# Patient Record
Sex: Male | Born: 1970 | Race: Black or African American | Hispanic: No | Marital: Single | Smoking: Never smoker
Health system: Southern US, Community
[De-identification: ages and names within clinical notes are randomized; demographics above are authoritative.]

## PROBLEM LIST (undated history)

## (undated) DIAGNOSIS — I10 Essential (primary) hypertension: Secondary | ICD-10-CM

## (undated) DIAGNOSIS — M1990 Unspecified osteoarthritis, unspecified site: Secondary | ICD-10-CM

## (undated) HISTORY — PX: ROTATOR CUFF REPAIR: SHX139

## (undated) HISTORY — PX: KNEE SURGERY: SHX244

## (undated) HISTORY — PX: COSMETIC SURGERY: SHX468

---

## 2016-09-17 ENCOUNTER — Encounter (HOSPITAL_COMMUNITY): Payer: Self-pay | Admitting: Physical Medicine and Rehabilitation

## 2016-09-17 ENCOUNTER — Emergency Department (HOSPITAL_COMMUNITY): Payer: Medicare Other

## 2016-09-17 ENCOUNTER — Emergency Department (HOSPITAL_COMMUNITY)
Admission: EM | Admit: 2016-09-17 | Discharge: 2016-09-17 | Disposition: A | Discharge status: Home / Self Care | Payer: Medicare Other | Attending: Emergency Medicine | Admitting: Emergency Medicine

## 2016-09-17 DIAGNOSIS — Y999 Unspecified external cause status: Secondary | ICD-10-CM | POA: Diagnosis not present

## 2016-09-17 DIAGNOSIS — S99912A Unspecified injury of left ankle, initial encounter: Secondary | ICD-10-CM | POA: Diagnosis present

## 2016-09-17 DIAGNOSIS — Y92410 Unspecified street and highway as the place of occurrence of the external cause: Secondary | ICD-10-CM | POA: Insufficient documentation

## 2016-09-17 DIAGNOSIS — Y939 Activity, unspecified: Secondary | ICD-10-CM | POA: Diagnosis not present

## 2016-09-17 DIAGNOSIS — S93402A Sprain of unspecified ligament of left ankle, initial encounter: Secondary | ICD-10-CM | POA: Diagnosis not present

## 2016-09-17 DIAGNOSIS — W010XXA Fall on same level from slipping, tripping and stumbling without subsequent striking against object, initial encounter: Secondary | ICD-10-CM | POA: Diagnosis not present

## 2016-09-17 MED ORDER — IBUPROFEN 400 MG PO TABS
400.0000 mg | ORAL_TABLET | Freq: Once | ORAL | Status: AC
  Administered 2016-09-17: 400 mg via ORAL
  Filled 2016-09-17: qty 1

## 2016-09-17 MED ORDER — HYDROCODONE-ACETAMINOPHEN 5-325 MG PO TABS: ORAL_TABLET | ORAL | 0 refills | Status: DC

## 2016-09-17 NOTE — ED Notes (Signed)
Pt d/c home via w/c.

## 2016-09-17 NOTE — Discharge Instructions (Signed)
Rest, Ice intermittently (in the first 24-48 hours), Gentle compression with an Ace wrap, and elevate (Limb above the level of the heart) °  °Take up to 800mg of ibuprofen (that is usually 4 over the counter pills)  3 times a day for 5 days. Take with food. ° °Please follow with your primary care doctor in the next 2 days for a check-up. They must obtain records for further management.  ° °Do not hesitate to return to the Emergency Department for any new, worsening or concerning symptoms.  ° °

## 2016-09-17 NOTE — ED Provider Notes (Signed)
[facility] Provider Note   CSN: [id number] Arrival date & time: 09/17/16  1412  By signing my name below, I, Freida Busman, attest that this documentation has been prepared under the direction and in the presence of non-physician practitioner, Wynetta Emery, PA-C. Electronically Signed: Freida Busman, Scribe. 09/17/2016. 3:42 PM.  History   Chief Complaint Chief Complaint  Patient presents with  . Ankle Pain    The history is provided by the patient. No language interpreter was used.     HPI Comments:  Johnny Warner is a 45 y.o. male who presents to the Emergency Department complaining of moderate, throbbing, left ankle pain s/p fall today. Pt states he slipped,  fell forward, and his left leg ended up underneath him. His pain is exacerbated when he attempts to bear weight. Pt has no other complaints or symptoms at this time. No alleviating factors noted.    History reviewed. No pertinent past medical history.  There are no active problems to display for this patient.   History reviewed. No pertinent surgical history.     Home Medications    Prior to Admission medications   Not on File    Family History No family history on file.  Social History Social History  Substance Use Topics  . Smoking status: Never Smoker  . Smokeless tobacco: Never Used  . Alcohol use No     Allergies   Review of patient's allergies indicates no known allergies.   Review of Systems Review of Systems  10 systems reviewed and all are negative for acute change except as noted in the HPI.  Physical Exam Updated Vital Signs BP 114/81   Pulse 62   Temp 98.2 F (36.8 C) (Oral)   Resp 18   SpO2 99%   Physical Exam  Constitutional: He is oriented to person, place, and time. He appears well-developed and well-nourished. No distress.  HENT:  Head: Normocephalic and atraumatic.  Eyes: Conjunctivae are normal.  Cardiovascular: Normal rate.   Pulmonary/Chest: Effort  normal.  Abdominal: He exhibits no distension.  Musculoskeletal: Normal range of motion. He exhibits tenderness. He exhibits no edema or deformity.  Left ankle: No deformity, no overlying skin changes, mild swelling and tenderness to palpation along the inferior, lateral malleolus. No bony tenderness palpation, distally neurovascularly intact.   Neurological: He is alert and oriented to person, place, and time. Cranial nerve deficit: .npmdm.  Skin: Skin is warm and dry.  Psychiatric: He has a normal mood and affect.  Nursing note and vitals reviewed.  ED Treatments / Results  DIAGNOSTIC STUDIES:  Oxygen Saturation is 99% on RA, normal by my interpretation.    COORDINATION OF CARE:  3:41 PM Discussed treatment plan with pt at bedside and pt agreed to plan.  Labs (all labs ordered are listed, but only abnormal results are displayed) Labs Reviewed - No data to display  EKG  EKG Interpretation None       Radiology Dg Ankle Complete Left  Result Date: 09/17/2016 CLINICAL DATA:  Left ankle pain after fall today. EXAM: LEFT ANKLE COMPLETE - 3+ VIEW COMPARISON:  None. FINDINGS: There is no evidence of fracture, dislocation, or joint effusion. There is no evidence of arthropathy or other focal bone abnormality. Soft tissues are unremarkable. IMPRESSION: Normal left ankle. Electronically Signed   By: Lupita Raider, M.D.   On: 09/17/2016 15:31    Procedures Procedures (including critical care time)  Medications Ordered in ED Medications - No data to display  Initial Impression / Assessment and Plan / ED Course  I have reviewed the triage vital signs and the nursing notes.  Pertinent labs & imaging results that were available during my care of the patient were reviewed by me and considered in my medical decision making (see chart for details).  Clinical Course    Vitals:   09/17/16 1501  BP: 114/81  Pulse: 62  Resp: 18  Temp: 98.2 F (36.8 C)  TempSrc: Oral  SpO2:  99%    Medications  ibuprofen (ADVIL,MOTRIN) tablet 400 mg (400 mg Oral Given 09/17/16 1555)    Johnny Warner is 45 y.o. male presenting with Ankle pain status post mechanical slip and fall with no other trauma. Neurovascularly intact with mild tenderness to palpation. X-rays negative. Will treat with rest, ice, compression elevation, NSAIDS. Patient given crutches and an orthopedic follow-up.   Evaluation does not show pathology that would require ongoing emergent intervention or inpatient treatment. Pt is hemodynamically stable and mentating appropriately. Discussed findings and plan with patient/guardian, who agrees with care plan. All questions answered. Return precautions discussed and outpatient follow up given.      Final Clinical Impressions(s) / ED Diagnoses   Final diagnoses:  None    New Prescriptions New Prescriptions   No medications on file    I personally performed the services described in this documentation, which was scribed in my presence. The recorded information has been reviewed and is accurate.    Wynetta Emeryicole Zakkary Thibault, PA-C 09/17/16 1730    Lyndal Pulleyaniel Knott, MD 09/18/16 [phone]

## 2016-09-17 NOTE — ED Notes (Signed)
Registration at bedside with pt.  

## 2016-09-17 NOTE — ED Triage Notes (Signed)
Pt reports he fell this morning, states he slipped on broom handle on street. Now c/o L ankle pain.

## 2016-09-17 NOTE — ED Notes (Signed)
Patient transported to X-ray 

## 2018-02-03 ENCOUNTER — Emergency Department (HOSPITAL_COMMUNITY)
Admission: EM | Admit: 2018-02-03 | Discharge: 2018-02-04 | Disposition: A | Discharge status: Home / Self Care | Payer: Medicare Other | Attending: Emergency Medicine | Admitting: Emergency Medicine

## 2018-02-03 ENCOUNTER — Emergency Department (HOSPITAL_COMMUNITY): Payer: Medicare Other

## 2018-02-03 ENCOUNTER — Encounter (HOSPITAL_COMMUNITY): Payer: Self-pay | Admitting: Emergency Medicine

## 2018-02-03 DIAGNOSIS — I10 Essential (primary) hypertension: Secondary | ICD-10-CM | POA: Diagnosis not present

## 2018-02-03 DIAGNOSIS — R0789 Other chest pain: Secondary | ICD-10-CM

## 2018-02-03 DIAGNOSIS — R079 Chest pain, unspecified: Secondary | ICD-10-CM | POA: Diagnosis present

## 2018-02-03 HISTORY — DX: Essential (primary) hypertension: I10

## 2018-02-03 LAB — CBC
HEMATOCRIT: 44.5 % (ref 39.0–52.0)
HEMOGLOBIN: 15 g/dL (ref 13.0–17.0)
MCH: 30.5 pg (ref 26.0–34.0)
MCHC: 33.7 g/dL (ref 30.0–36.0)
MCV: 90.4 fL (ref 78.0–100.0)
Platelets: 216 10*3/uL (ref 150–400)
RBC: 4.92 MIL/uL (ref 4.22–5.81)
RDW: 12.9 % (ref 11.5–15.5)
WBC: 7.6 10*3/uL (ref 4.0–10.5)

## 2018-02-03 LAB — BASIC METABOLIC PANEL
ANION GAP: 10 (ref 5–15)
BUN: 15 mg/dL (ref 6–20)
CO2: 24 mmol/L (ref 22–32)
Calcium: 9.1 mg/dL (ref 8.9–10.3)
Chloride: 105 mmol/L (ref 101–111)
Creatinine, Ser: 1.14 mg/dL (ref 0.61–1.24)
GFR calc Af Amer: >60 mL/min (ref >60)
Glucose, Bld: 101 mg/dL — ABNORMAL HIGH (ref 65–99)
POTASSIUM: 3.8 mmol/L (ref 3.5–5.1)
SODIUM: 139 mmol/L (ref 135–145)

## 2018-02-03 LAB — I-STAT TROPONIN, ED
Troponin i, poc: 0 ng/mL (ref 0.00–0.08)
Troponin i, poc: 0 ng/mL (ref 0.00–0.08)

## 2018-02-03 MED ORDER — IBUPROFEN 800 MG PO TABS: 800.0000 mg | ORAL_TABLET | Freq: Three times a day (TID) | ORAL | 0 refills | Status: DC

## 2018-02-03 MED ORDER — KETOROLAC TROMETHAMINE 60 MG/2ML IM SOLN
30.0000 mg | Freq: Once | INTRAMUSCULAR | Status: AC
  Administered 2018-02-03: 30 mg via INTRAMUSCULAR
  Filled 2018-02-03: qty 2

## 2018-02-03 NOTE — ED Provider Notes (Addendum)
[facility] EMERGENCY DEPARTMENT Provider Note   CSN: [id number] Arrival date & time: 02/03/18  1810     History   Chief Complaint Chief Complaint  Patient presents with  . Chest Pain    HPI Johnny Warner is a 47 y.o. male.  The history is provided by the patient.  Chest Pain   This is a new problem. The current episode started 6 to 12 hours ago. The problem occurs constantly. The problem has not changed since onset.The pain is associated with movement. The pain is present in the lateral region. The pain is moderate. The quality of the pain is described as dull. The pain does not radiate. The symptoms are aggravated by certain positions. Pertinent negatives include no abdominal pain, no back pain, no claudication, no cough, no diaphoresis, no dizziness, no exertional chest pressure, no fever, no headaches, no hemoptysis, no irregular heartbeat, no leg pain, no lower extremity edema, no malaise/fatigue, no nausea, no near-syncope, no numbness, no orthopnea, no palpitations, no PND, no shortness of breath, no sputum production, no syncope, no vomiting and no weakness. He has tried nothing for the symptoms. The treatment provided no relief. Risk factors include male gender.  Pertinent negatives for past medical history include Turner syndrome.  Pertinent negatives for family medical history include: no Marfan's syndrome.  Procedure history is negative for persantine thallium.  No leg swelling or pain, no long car trips or plan trips.  No surgery.  No leg pain or swelling.  No n/v/d.  No exertional symptoms.  Slept on a pull out bed last night and awoke with pain.    Past Medical History:  Diagnosis Date  . Hypertension     There are no active problems to display for this patient.   History reviewed. No pertinent surgical history.     Home Medications    Prior to Admission medications   Medication Sig Start Date End Date Taking? Authorizing Provider    HYDROcodone-acetaminophen (NORCO/VICODIN) 5-325 MG tablet Take 1-2 tablets by mouth every 6 hours as needed for pain and/or cough. 09/17/16   Pisciotta, Mardella Layman    Family History History reviewed. No pertinent family history.  Social History Social History   Tobacco Use  . Smoking status: Never Smoker  . Smokeless tobacco: Never Used  Substance Use Topics  . Alcohol use: No  . Drug use: No     Allergies   Patient has no known allergies.   Review of Systems Review of Systems  Constitutional: Negative for diaphoresis, fever and malaise/fatigue.  Respiratory: Negative for cough, hemoptysis, sputum production, choking, chest tightness and shortness of breath.   Cardiovascular: Positive for chest pain. Negative for palpitations, orthopnea, claudication, leg swelling, syncope, PND and near-syncope.  Gastrointestinal: Negative for abdominal pain, nausea and vomiting.  Musculoskeletal: Negative for arthralgias, back pain, neck pain and neck stiffness.  Neurological: Negative for dizziness, weakness, numbness and headaches.  All other systems reviewed and are negative.    Physical Exam Updated Vital Signs BP (!) 139/101 (BP Location: Right Arm)   Pulse 63   Temp 97.9 F (36.6 C) (Oral)   Resp 18   Ht 5\' 8"  (1.727 m)   Wt 111.1 kg (245 lb)   SpO2 97%   BMI 37.25 kg/m   Physical Exam  Constitutional: He is oriented to person, place, and time. He appears well-developed and well-nourished. No distress.  HENT:  Head: Normocephalic and atraumatic.  Mouth/Throat: No oropharyngeal exudate.  Eyes: Conjunctivae are  normal. Pupils are equal, round, and reactive to light.  Neck: Normal range of motion.  Cardiovascular: Normal rate, regular rhythm, normal heart sounds and intact distal pulses.  Pulmonary/Chest: Effort normal and breath sounds normal. No stridor. No respiratory distress. He has no wheezes. He has no rales. He exhibits tenderness.  Abdominal: Soft. Bowel sounds  are normal. He exhibits no mass. There is no tenderness. There is no rebound and no guarding. No hernia.  Musculoskeletal: Normal range of motion. He exhibits no edema, tenderness or deformity.  Negative Homan's sign  Neurological: He is alert and oriented to person, place, and time. He displays normal reflexes.  Skin: Skin is warm and dry. Capillary refill takes less than 2 seconds.  Psychiatric: He has a normal mood and affect.  Nursing note and vitals reviewed.    ED Treatments / Results  Labs (all labs ordered are listed, but only abnormal results are displayed)  Results for orders placed or performed during the hospital encounter of 02/03/18  Basic metabolic panel  Result Value Ref Range   Sodium 139 135 - 145 mmol/L   Potassium 3.8 3.5 - 5.1 mmol/L   Chloride 105 101 - 111 mmol/L   CO2 24 22 - 32 mmol/L   Glucose, Bld 101 (H) 65 - 99 mg/dL   BUN 15 6 - 20 mg/dL   Creatinine, Ser 1.30 0.61 - 1.24 mg/dL   Calcium 9.1 8.9 - 86.5 mg/dL   GFR calc non Af Amer >60 >60 mL/min   GFR calc Af Amer >60 >60 mL/min   Anion gap 10 5 - 15  CBC  Result Value Ref Range   WBC 7.6 4.0 - 10.5 K/uL   RBC 4.92 4.22 - 5.81 MIL/uL   Hemoglobin 15.0 13.0 - 17.0 g/dL   HCT 78.4 69.6 - 29.5 %   MCV 90.4 78.0 - 100.0 fL   MCH 30.5 26.0 - 34.0 pg   MCHC 33.7 30.0 - 36.0 g/dL   RDW 28.4 13.2 - 44.0 %   Platelets 216 150 - 400 K/uL  I-stat troponin, ED  Result Value Ref Range   Troponin i, poc 0.00 0.00 - 0.08 ng/mL   Comment 3          I-Stat Troponin, ED (not at [facility])  Result Value Ref Range   Troponin i, poc 0.00 0.00 - 0.08 ng/mL   Comment 3           Dg Chest 2 View  Result Date: 02/03/2018 CLINICAL DATA:  Left-sided chest pain for 2 hours. EXAM: CHEST  2 VIEW COMPARISON:  None. FINDINGS: The cardiac silhouette, mediastinal and hilar contours are within normal limits given the low lung volumes. Low lung volumes with streaky basilar atelectasis and mild vascular crowding. No infiltrates  or effusions. No pulmonary edema. IMPRESSION: No acute cardiopulmonary findings. Electronically Signed   By: Rudie Meyer M.D.   On: 02/03/2018 18:51    EKG  EKG Interpretation  Date/Time:  Thursday February 03 2018 18:13:45 EST Ventricular Rate:  59 PR Interval:  164 QRS Duration: 90 QT Interval:  418 QTC Calculation: 413 R Axis:   39 Text Interpretation:  Sinus bradycardia Confirmed by Montarius Kitagawa ([id number]) on 02/03/2018 11:06:53 PM       Radiology Dg Chest 2 View  Result Date: 02/03/2018 CLINICAL DATA:  Left-sided chest pain for 2 hours. EXAM: CHEST  2 VIEW COMPARISON:  None. FINDINGS: The cardiac silhouette, mediastinal and hilar contours are within normal limits given the  low lung volumes. Low lung volumes with streaky basilar atelectasis and mild vascular crowding. No infiltrates or effusions. No pulmonary edema. IMPRESSION: No acute cardiopulmonary findings. Electronically Signed   By: Rudie MeyerP.  Gallerani M.D.   On: 02/03/2018 18:51    Procedures Procedures (including critical care time)  Medications Ordered in ED Medications  ketorolac (TORADOL) injection 30 mg (not administered)    PERC negative wells 0 highly doubt PE in this very low risk patient.  HEART score is 1 very low risk for MACE.   Ruled out for MI in the ED with 2 negative troponins and a normal EKG and pain is ongoing.  Pain is highly atypical and consistent with MSK pain as I was able to completely to reproduce it on exam.     Final Clinical Impressions(s) / ED Diagnoses     Return for weakness, numbness, changes in vision or speech,  fevers > 100.4 unrelieved by medication, shortness of breath, intractable vomiting, or diarrhea, abdominal pain, Inability to tolerate liquids or food, cough, altered mental status or any concerns. No signs of systemic illness or infection. The patient is nontoxic-appearing on exam and vital signs are within normal limits.    I have reviewed the triage vital signs and the  nursing notes. Pertinent labs &imaging results that were available during my care of the patient were reviewed by me and considered in my medical decision making (see chart for details).  After history, exam, and medical workup I feel the patient has been appropriately medically screened and is safe for discharge home. Pertinent diagnoses were discussed with the patient. Patient was given return precautions.   Ector Laurel, MD 02/03/18 Doralee [id number]    Marquasha Brutus, MD 02/03/18 2349

## 2018-02-03 NOTE — ED Triage Notes (Signed)
Pt presents to ED for assessment of left sided, sudden onset chest pain (while sitting), denies any other associated symptoms, states the pain is intermittent.

## 2018-02-03 NOTE — ED Notes (Signed)
Patient denies pain and is resting comfortably.  

## 2019-01-13 ENCOUNTER — Encounter (HOSPITAL_COMMUNITY): Payer: Self-pay | Admitting: Emergency Medicine

## 2019-01-13 ENCOUNTER — Ambulatory Visit (HOSPITAL_COMMUNITY)
Admission: EM | Admit: 2019-01-13 | Discharge: 2019-01-13 | Disposition: A | Discharge status: Home / Self Care | Payer: Medicare Other | Attending: Family Medicine | Admitting: Family Medicine

## 2019-01-13 ENCOUNTER — Telehealth (HOSPITAL_COMMUNITY): Payer: Self-pay | Admitting: Emergency Medicine

## 2019-01-13 DIAGNOSIS — R0789 Other chest pain: Secondary | ICD-10-CM | POA: Insufficient documentation

## 2019-01-13 MED ORDER — NAPROXEN 500 MG PO TABS: 500.0000 mg | ORAL_TABLET | Freq: Two times a day (BID) | ORAL | 0 refills | Status: DC

## 2019-01-13 MED ORDER — KETOROLAC TROMETHAMINE 60 MG/2ML IM SOLN
INTRAMUSCULAR | Status: AC
  Filled 2019-01-13: qty 2

## 2019-01-13 MED ORDER — KETOROLAC TROMETHAMINE 60 MG/2ML IM SOLN
60.0000 mg | Freq: Once | INTRAMUSCULAR | Status: AC
  Administered 2019-01-13: 60 mg via INTRAMUSCULAR

## 2019-01-13 NOTE — ED Triage Notes (Signed)
ekg given to Dr. Delton See

## 2019-01-13 NOTE — ED Triage Notes (Signed)
Pt c/o intermittent sharp CP onset yest ... pain increases w/deep breaths... hurts to the touch or when he raises right arm.   Taking 800 mg of ibuprofen w/relief.   Denies SOB, dyspnea, diaphoresis   Pt A&O x4.Marland KitchenMarland KitchenMarland Kitchen pt is laughing.... ambulatory

## 2019-01-13 NOTE — Telephone Encounter (Signed)
Pt requested for Naproxen to be sent to [facility] ([place]) electronically.   Sent.

## 2019-01-13 NOTE — Discharge Instructions (Addendum)
Pain seems to be more muscular We gave you a shot of Toradol today Take naprosyn twice daily as needed for pain, take consistently over next 1-2 weeks  Please follow-up if your chest pain is changing, worsening, developing cough, shortness of breath, fevers, lightheadedness, dizziness, nausea or vomiting

## 2019-01-14 NOTE — ED Provider Notes (Signed)
[facility]    CSN: [id number] Arrival date & time: 01/13/19  1344     History   Chief Complaint Chief Complaint  Patient presents with  . Chest Pain    HPI Johnny Warner is a 48 y.o. male history of hypertension presenting today for evaluation of chest pain.  Patient states that he has had intermittent sharp sensations in his left chest.  Symptoms began yesterday evening and have been off and on since.  Worse when he takes a breath, more specifically with exhaling, denies pain with inhaling.  He also notices pain with touching this area as well as raising his left arm.  He denies any cough or shortness of breath.  Denies being sick of recently.  Denies nausea or vomiting or abdominal pain.  Denies dizziness or lightheadedness.  He tried taking 800 mg of ibuprofen last night which helped mildly, but symptoms returned when ibuprofen wore off.  Denies any vision changes or headaches.  Denies history of diabetes.  Denies smoking history.  Denies leg pain or leg swelling.  Denies recent travel or immobilization.  HPI  Past Medical History:  Diagnosis Date  . Hypertension     There are no active problems to display for this patient.   History reviewed. No pertinent surgical history.     Home Medications    Prior to Admission medications   Medication Sig Start Date End Date Taking? Authorizing Provider  amLODipine (NORVASC) 5 MG tablet Take 5 mg by mouth daily.   Yes [provider]  losartan-hydrochlorothiazide (HYZAAR) 100-12.5 MG tablet Take 1 tablet by mouth daily.   Yes [provider]  HYDROcodone-acetaminophen (NORCO/VICODIN) 5-325 MG tablet Take 1-2 tablets by mouth every 6 hours as needed for pain and/or cough. 09/17/16   Pisciotta, Joni Reining, PA-C  ibuprofen (ADVIL,MOTRIN) 800 MG tablet Take 1 tablet (800 mg total) by mouth 3 (three) times daily. 02/03/18   Palumbo, April, MD  naproxen (NAPROSYN) 500 MG tablet Take 1 tablet (500 mg total) by  mouth 2 (two) times daily. 01/13/19   , Junius Creamer, PA-C    Family History History reviewed. No pertinent family history.  Social History Social History   Tobacco Use  . Smoking status: Never Smoker  . Smokeless tobacco: Never Used  Substance Use Topics  . Alcohol use: No  . Drug use: No     Allergies   Patient has no known allergies.   Review of Systems Review of Systems  Constitutional: Negative for fatigue and fever.  HENT: Negative for congestion, sinus pressure and sore throat.   Eyes: Negative for photophobia, pain and visual disturbance.  Respiratory: Negative for cough and shortness of breath.   Cardiovascular: Positive for chest pain.  Gastrointestinal: Negative for abdominal pain, nausea and vomiting.  Genitourinary: Negative for decreased urine volume and hematuria.  Musculoskeletal: Negative for myalgias, neck pain and neck stiffness.  Neurological: Negative for dizziness, syncope, facial asymmetry, speech difficulty, weakness, light-headedness, numbness and headaches.     Physical Exam Triage Vital Signs ED Triage Vitals  Enc Vitals Group     BP 01/13/19 1354 128/78     Pulse Rate 01/13/19 1354 (!) 57     Resp 01/13/19 1354 20     Temp 01/13/19 1354 (!) 97.4 F (36.3 C)     Temp Source 01/13/19 1354 Oral     SpO2 01/13/19 1354 100 %     Weight --      Height --  Head Circumference --      Peak Flow --      Pain Score 01/13/19 1357 0     Pain Loc --      Pain Edu? --      Excl. in GC? --    No data found.  Updated Vital Signs BP 128/78 (BP Location: Right Arm)   Pulse (!) 57   Temp (!) 97.4 F (36.3 C) (Oral)   Resp 20   SpO2 100%   Visual Acuity Right Eye Distance:   Left Eye Distance:   Bilateral Distance:    Right Eye Near:   Left Eye Near:    Bilateral Near:     Physical Exam Vitals signs and nursing note reviewed.  Constitutional:      Appearance: He is well-developed.  HENT:     Head: Normocephalic and  atraumatic.     Mouth/Throat:     Comments: Oral mucosa pink and moist, no tonsillar enlargement or exudate. Posterior pharynx patent and nonerythematous, no uvula deviation or swelling. Normal phonation. Eyes:     Extraocular Movements: Extraocular movements intact.     Conjunctiva/sclera: Conjunctivae normal.     Pupils: Pupils are equal, round, and reactive to light.  Neck:     Musculoskeletal: Neck supple.  Cardiovascular:     Rate and Rhythm: Normal rate and regular rhythm.     Heart sounds: No murmur.  Pulmonary:     Effort: Pulmonary effort is normal. No respiratory distress.     Breath sounds: Normal breath sounds.     Comments: Breathing comfortably at rest, CTABL, no wheezing, rales or other adventitious sounds auscultated  Anterior chest tender to palpation to left lower rib cage below left breast area approximately T9/T10 Chest:     Chest wall: Tenderness present.  Abdominal:     Palpations: Abdomen is soft.     Tenderness: There is no abdominal tenderness.  Musculoskeletal:     Comments: No lower extremity swelling or erythema  Skin:    General: Skin is warm and dry.  Neurological:     Mental Status: He is alert.      UC Treatments / Results  Labs (all labs ordered are listed, but only abnormal results are displayed) Labs Reviewed - No data to display  EKG None  Radiology No results found.  Procedures Procedures (including critical care time)  Medications Ordered in UC Medications  ketorolac (TORADOL) injection 60 mg (60 mg Intramuscular Given 01/13/19 1601)    Initial Impression / Assessment and Plan / UC Course  I have reviewed the triage vital signs and the nursing notes.  Pertinent labs & imaging results that were available during my care of the patient were reviewed by me and considered in my medical decision making (see chart for details).     EKG normal sinus rhythm, no acute signs of ischemia or infarction.  Most likely musculoskeletal  cause given reproducibility on exam.  Will recommend anti-inflammatories for this, Toradol in clinic prior to discharge Naprosyn for the next 1 to 2 weeks.  Continue to monitor,Discussed strict return precautions. Patient verbalized understanding and is agreeable with plan.  Final Clinical Impressions(s) / UC Diagnoses   Final diagnoses:  Chest wall pain     Discharge Instructions     Pain seems to be more muscular We gave you a shot of Toradol today Take naprosyn twice daily as needed for pain, take consistently over next 1-2 weeks  Please follow-up if your chest pain  is changing, worsening, developing cough, shortness of breath, fevers, lightheadedness, dizziness, nausea or vomiting     ED Prescriptions    Medication Sig Dispense Auth. Provider   naproxen (NAPROSYN) 500 MG tablet Take 1 tablet (500 mg total) by mouth 2 (two) times daily. 30 tablet , [place] C, PA-C     Controlled Substance Prescriptions [place] Controlled Substance Registry consulted? Not Applicable   Lew Dawes, [place] 01/14/19 [phone]

## 2019-02-05 ENCOUNTER — Other Ambulatory Visit: Payer: Self-pay

## 2019-02-05 ENCOUNTER — Encounter (HOSPITAL_COMMUNITY): Payer: Self-pay | Admitting: *Deleted

## 2019-02-05 ENCOUNTER — Ambulatory Visit (HOSPITAL_COMMUNITY)
Admission: EM | Admit: 2019-02-05 | Discharge: 2019-02-05 | Disposition: A | Discharge status: Home / Self Care | Payer: Medicare Other | Attending: Family Medicine | Admitting: Family Medicine

## 2019-02-05 DIAGNOSIS — B354 Tinea corporis: Secondary | ICD-10-CM | POA: Diagnosis not present

## 2019-02-05 HISTORY — DX: Unspecified osteoarthritis, unspecified site: M19.90

## 2019-02-05 MED ORDER — HYDROXYZINE HCL 25 MG PO TABS: 25.0000 mg | ORAL_TABLET | Freq: Four times a day (QID) | ORAL | 0 refills | Status: AC

## 2019-02-05 MED ORDER — FLUCONAZOLE 150 MG PO TABS: 150.0000 mg | ORAL_TABLET | Freq: Every day | ORAL | 0 refills | Status: AC

## 2019-02-05 MED ORDER — CLOTRIMAZOLE-BETAMETHASONE 1-0.05 % EX CREA: TOPICAL_CREAM | CUTANEOUS | 0 refills | Status: AC

## 2019-02-05 NOTE — ED Provider Notes (Signed)
[facility]    CSN: [id number] Arrival date & time: 02/05/19  1626     History   Chief Complaint Chief Complaint  Patient presents with  . Rash    HPI Johnny Warner is a 48 y.o. male.   HPI  Patient states is had a rash for about a week.  It spreading.  Starting to itch.  He like to have it evaluated. He has no new soap lotion powder product.  He does not think it is an allergic reaction.  No new medicines or supplements. He has not been exposed to anybody with rash.  No one else in his family has an itch He has never had this rash before  Past Medical History:  Diagnosis Date  . Arthritis   . Hypertension     There are no active problems to display for this patient.   Past Surgical History:  Procedure Laterality Date  . COSMETIC SURGERY    . KNEE SURGERY    . ROTATOR CUFF REPAIR         Home Medications    Prior to Admission medications   Medication Sig Start Date End Date Taking? Authorizing Provider  amLODipine (NORVASC) 5 MG tablet Take 5 mg by mouth daily.   Yes [provider]  Cyanocobalamin (VITAMIN B12 PO) Take by mouth.   Yes [provider]  losartan-hydrochlorothiazide (HYZAAR) 100-12.5 MG tablet Take 1 tablet by mouth daily.   Yes [provider]  UNKNOWN TO PATIENT Allergy med OTC   Yes [provider]  VITAMIN D PO Take by mouth.   Yes [provider]  clotrimazole-betamethasone (LOTRISONE) cream Apply to affected area 2 times daily prn 02/05/19   Eustace Moore, MD  fluconazole (DIFLUCAN) 150 MG tablet Take 1 tablet (150 mg total) by mouth daily. Repeat in 1 week if needed 02/05/19   Eustace Moore, MD  hydrOXYzine (ATARAX/VISTARIL) 25 MG tablet Take 1 tablet (25 mg total) by mouth every 6 (six) hours. 02/05/19   Eustace Moore, MD    Family History Family History  Problem Relation Age of Onset  . Diabetes Mother   . Cancer Mother   . Heart disease Father     Social  History Social History   Tobacco Use  . Smoking status: Never Smoker  . Smokeless tobacco: Never Used  Substance Use Topics  . Alcohol use: No  . Drug use: No     Allergies   Patient has no known allergies.   Review of Systems Review of Systems  Constitutional: Negative for chills and fever.  HENT: Negative for congestion, ear pain and sore throat.   Eyes: Negative for pain and visual disturbance.  Respiratory: Negative for cough and shortness of breath.   Cardiovascular: Negative for chest pain and palpitations.  Gastrointestinal: Negative for abdominal pain and vomiting.  Genitourinary: Negative for dysuria and hematuria.  Musculoskeletal: Negative for arthralgias and back pain.  Skin: Positive for rash. Negative for color change.  Neurological: Negative for seizures and syncope.  All other systems reviewed and are negative.    Physical Exam Triage Vital Signs ED Triage Vitals  Enc Vitals Group     BP 02/05/19 1730 130/77     Pulse Rate 02/05/19 1729 62     Resp 02/05/19 1729 16     Temp 02/05/19 1729 99 F (37.2 C)     Temp Source 02/05/19 1729 Temporal     SpO2 02/05/19 1729 100 %  Weight --      Height --      Head Circumference --      Peak Flow --      Pain Score 02/05/19 1730 0     Pain Loc --      Pain Edu? --      Excl. in GC? --    No data found.  Updated Vital Signs BP 130/77   Pulse 62   Temp 99 F (37.2 C) (Temporal)   Resp 16   SpO2 100%      Physical Exam Constitutional:      General: He is not in acute distress.    Appearance: He is well-developed.  HENT:     Head: Normocephalic and atraumatic.  Eyes:     Conjunctiva/sclera: Conjunctivae normal.     Pupils: Pupils are equal, round, and reactive to light.  Neck:     Musculoskeletal: Normal range of motion.  Cardiovascular:     Rate and Rhythm: Normal rate.  Pulmonary:     Effort: Pulmonary effort is normal. No respiratory distress.  Abdominal:     General: There is no  distension.     Palpations: Abdomen is soft.  Musculoskeletal: Normal range of motion.  Skin:    General: Skin is warm and dry.     Comments: See photos  Neurological:     Mental Status: He is alert.        Above pictures are representative.  Scattered lesions on arms and upper thighs, across lower abdomen under pannus  UC Treatments / Results  Labs (all labs ordered are listed, but only abnormal results are displayed) Labs Reviewed - No data to display  EKG None  Radiology No results found.  Procedures Procedures (including critical care time)  Medications Ordered in UC Medications - No data to display  Initial Impression / Assessment and Plan / UC Course  I have reviewed the triage vital signs and the nursing notes.  Pertinent labs & imaging results that were available during my care of the patient were reviewed by me and considered in my medical decision making (see chart for details).     Likely tinea corporis.  Scaling lesions with active periphery. Final Clinical Impressions(s) / UC Diagnoses   Final diagnoses:  Tinea corporis     Discharge Instructions     Take Diflucan as soon as you get your prescription filled.  Take the second pill in 1 week Use the Lotrisone 2-3 times a day on the rash spots Take hydroxyzine as needed at bedtime for itching See your primary care doctor if not better in 2 weeks   ED Prescriptions    Medication Sig Dispense Auth. Provider   fluconazole (DIFLUCAN) 150 MG tablet Take 1 tablet (150 mg total) by mouth daily. Repeat in 1 week if needed 2 tablet Eustace Moore, MD   clotrimazole-betamethasone (LOTRISONE) cream Apply to affected area 2 times daily prn 15 g Eustace Moore, MD   hydrOXYzine (ATARAX/VISTARIL) 25 MG tablet Take 1 tablet (25 mg total) by mouth every 6 (six) hours. 12 tablet Eustace Moore, MD     Controlled Substance Prescriptions [place] Controlled Substance Registry consulted? Not Applicable     Eustace Moore, MD 02/05/19 2010

## 2019-02-05 NOTE — ED Triage Notes (Signed)
C/O rash to BUE and BLE x 1 wk; has started to become pruritic.

## 2019-02-05 NOTE — Discharge Instructions (Signed)
Take Diflucan as soon as you get your prescription filled.  Take the second pill in 1 week Use the Lotrisone 2-3 times a day on the rash spots Take hydroxyzine as needed at bedtime for itching See your primary care doctor if not better in 2 weeks

## 2019-08-22 ENCOUNTER — Ambulatory Visit (INDEPENDENT_AMBULATORY_CARE_PROVIDER_SITE_OTHER): Payer: Medicare Other

## 2019-08-22 ENCOUNTER — Ambulatory Visit (HOSPITAL_COMMUNITY)
Admission: EM | Admit: 2019-08-22 | Discharge: 2019-08-22 | Disposition: A | Discharge status: Home / Self Care | Payer: Medicare Other | Attending: Physician Assistant | Admitting: Physician Assistant

## 2019-08-22 ENCOUNTER — Telehealth (HOSPITAL_COMMUNITY): Payer: Self-pay | Admitting: Emergency Medicine

## 2019-08-22 ENCOUNTER — Encounter (HOSPITAL_COMMUNITY): Payer: Self-pay

## 2019-08-22 DIAGNOSIS — I10 Essential (primary) hypertension: Secondary | ICD-10-CM | POA: Diagnosis not present

## 2019-08-22 DIAGNOSIS — R0789 Other chest pain: Secondary | ICD-10-CM

## 2019-08-22 MED ORDER — METHOCARBAMOL 500 MG PO TABS: 500.0000 mg | ORAL_TABLET | Freq: Two times a day (BID) | ORAL | 0 refills | Status: AC

## 2019-08-22 MED ORDER — DICLOFENAC SODIUM 75 MG PO TBEC: 75.0000 mg | DELAYED_RELEASE_TABLET | Freq: Two times a day (BID) | ORAL | 0 refills | Status: AC

## 2019-08-22 NOTE — ED Triage Notes (Signed)
Pt presents with complaints of chest pain x 1 day. States is worse with deep breath and movement.

## 2019-08-22 NOTE — Telephone Encounter (Signed)
Pt called stating insurance would not cover the robaxin, contacted pharmacy, made them aware, will send flexeril as possible alternative, pt will use [employer] and pick which one is the cheapest. Pt agreeable to plan.

## 2019-08-22 NOTE — Discharge Instructions (Signed)
See your Physician for recheck in 1 week.  Go to the ED if symptoms worsen or change.

## 2019-08-22 NOTE — ED Provider Notes (Signed)
[facility]    CSN: [id number] Arrival date & time: 08/22/19  1032      History   Chief Complaint Chief Complaint  Patient presents with  . Chest Pain    HPI Johnny Warner is a 48 y.o. male.   The history is provided by the patient. No language interpreter was used.  Chest Pain Pain location:  R chest Pain quality: aching   Pain radiates to:  Does not radiate Onset quality:  Gradual Duration:  1 day Timing:  Constant Progression:  Unchanged Chronicity:  New Context: breathing and movement   Relieved by:  Nothing Worsened by:  Deep breathing and movement Ineffective treatments:  None tried Associated symptoms: no abdominal pain, no dizziness and no shortness of breath   Pt reports pain is continnous.  Pt reports pain is worse with lifting his right arm and with taking a deep breath.  Pt denies shortness of breath.  No sweating, no nausea.  No increase with walking.  Pt denies any PE risk.  Past Medical History:  Diagnosis Date  . Arthritis   . Hypertension     There are no active problems to display for this patient.   Past Surgical History:  Procedure Laterality Date  . COSMETIC SURGERY    . KNEE SURGERY    . ROTATOR CUFF REPAIR         Home Medications    Prior to Admission medications   Medication Sig Start Date End Date Taking? Authorizing Provider  amLODipine (NORVASC) 5 MG tablet Take 5 mg by mouth daily.   Yes [provider]  clotrimazole-betamethasone (LOTRISONE) cream Apply to affected area 2 times daily prn 02/05/19  Yes Eustace Moore, MD  Cyanocobalamin (VITAMIN B12 PO) Take by mouth.   Yes [provider]  losartan-hydrochlorothiazide (HYZAAR) 100-12.5 MG tablet Take 1 tablet by mouth daily.   Yes [provider]  UNKNOWN TO PATIENT Allergy med OTC   Yes [provider]  VITAMIN D PO Take by mouth.   Yes [provider]  fluconazole (DIFLUCAN) 150 MG tablet Take 1 tablet (150 mg  total) by mouth daily. Repeat in 1 week if needed 02/05/19   Eustace Moore, MD  hydrOXYzine (ATARAX/VISTARIL) 25 MG tablet Take 1 tablet (25 mg total) by mouth every 6 (six) hours. 02/05/19   Eustace Moore, MD    Family History Family History  Problem Relation Age of Onset  . Diabetes Mother   . Cancer Mother   . Heart disease Father     Social History Social History   Tobacco Use  . Smoking status: Never Smoker  . Smokeless tobacco: Never Used  Substance Use Topics  . Alcohol use: No  . Drug use: No     Allergies   Patient has no known allergies.   Review of Systems Review of Systems  Respiratory: Negative for shortness of breath.   Cardiovascular: Positive for chest pain.  Gastrointestinal: Negative for abdominal pain.  Musculoskeletal: Positive for myalgias. Negative for joint swelling and neck pain.  Neurological: Negative for dizziness.  All other systems reviewed and are negative.    Physical Exam Triage Vital Signs ED Triage Vitals  Enc Vitals Group     BP 08/22/19 1119 (!) 143/87     Pulse Rate 08/22/19 1119 (!) 58     Resp 08/22/19 1119 16     Temp 08/22/19 1119 97.9 F (36.6 C)     Temp src --  SpO2 08/22/19 1119 98 %     Weight --      Height --      Head Circumference --      Peak Flow --      Pain Score 08/22/19 1126 9     Pain Loc --      Pain Edu? --      Excl. in GC? --    No data found.  Updated Vital Signs BP (!) 143/87   Pulse (!) 58   Temp 97.9 F (36.6 C)   Resp 16   SpO2 98%   Visual Acuity Right Eye Distance:   Left Eye Distance:   Bilateral Distance:    Right Eye Near:   Left Eye Near:    Bilateral Near:     Physical Exam Vitals signs and nursing note reviewed.  Constitutional:      Appearance: He is well-developed.  HENT:     Head: Normocephalic and atraumatic.  Eyes:     Conjunctiva/sclera: Conjunctivae normal.  Neck:     Musculoskeletal: Neck supple.  Cardiovascular:     Rate and Rhythm:  Normal rate and regular rhythm.     Heart sounds: No murmur.  Pulmonary:     Effort: Pulmonary effort is normal. No respiratory distress.     Breath sounds: Normal breath sounds.  Abdominal:     Palpations: Abdomen is soft.     Tenderness: There is no abdominal tenderness.  Musculoskeletal:     Right lower leg: He exhibits tenderness.     Comments: Pain with abduction of right shoulder,  Pain to palpation right chest and shoulder  Skin:    General: Skin is warm and dry.  Neurological:     Mental Status: He is alert.      UC Treatments / Results  Labs (all labs ordered are listed, but only abnormal results are displayed) Labs Reviewed - No data to display  EKG   Radiology Dg Chest 2 View  Result Date: 08/22/2019 CLINICAL DATA:  RIGHT-sided chest pain for 1 day, worsening. EXAM: CHEST - 2 VIEW COMPARISON:  Chest x-ray dated 02/03/2018. FINDINGS: Stable cardiomegaly. Overall cardiomediastinal silhouette appears stable in size and configuration. Lungs are clear. No pleural effusion or pneumothorax seen. Osseous structures about the chest are unremarkable. IMPRESSION: Stable cardiomegaly. No active cardiopulmonary disease. No evidence of pneumonia or pulmonary edema. Electronically Signed   By: Bary RichardStan  Maynard M.D.   On: 08/22/2019 11:50    Procedures Procedures (including critical care time)  Medications Ordered in UC Medications - No data to display  Initial Impression / Assessment and Plan / UC Course  I have reviewed the triage vital signs and the nursing notes.  Pertinent labs & imaging results that were available during my care of the patient were reviewed by me and considered in my medical decision making (see chart for details).     MDM   EKG is normal  Chest xray is normal.  Pain seems muscular.  I doubt cardiac or pulmonary etiology.  I will treat pt for muscualr painn Final Clinical Impressions(s) / UC Diagnoses   Final diagnoses:  Chest wall pain      Discharge Instructions     See your Physician for recheck in 1 week.  Go to the ED if symptoms worsen or change.     ED Prescriptions    Medication Sig Dispense Auth. Provider   diclofenac (VOLTAREN) 75 MG EC tablet Take 1 tablet (75 mg total) by  mouth 2 (two) times daily. 20 tablet ,  K, [place]   methocarbamol (ROBAXIN) 500 MG tablet Take 1 tablet (500 mg total) by mouth 2 (two) times daily. 20 tablet Fransico Meadow, [place]     Controlled Substance Prescriptions [place] Controlled Substance Registry consulted? Not Applicable   Fransico Meadow, [place] 08/22/19 1221

## 2021-07-22 IMAGING — DX DG CHEST 2V
2 series · 2 of 2 positions shown · non-contrast
Comparison: Chest x-ray dated 02/03/2018.

CLINICAL DATA: RIGHT-sided chest pain for 1 day, worsening.

EXAM:
CHEST - 2 VIEW

[chest pa]
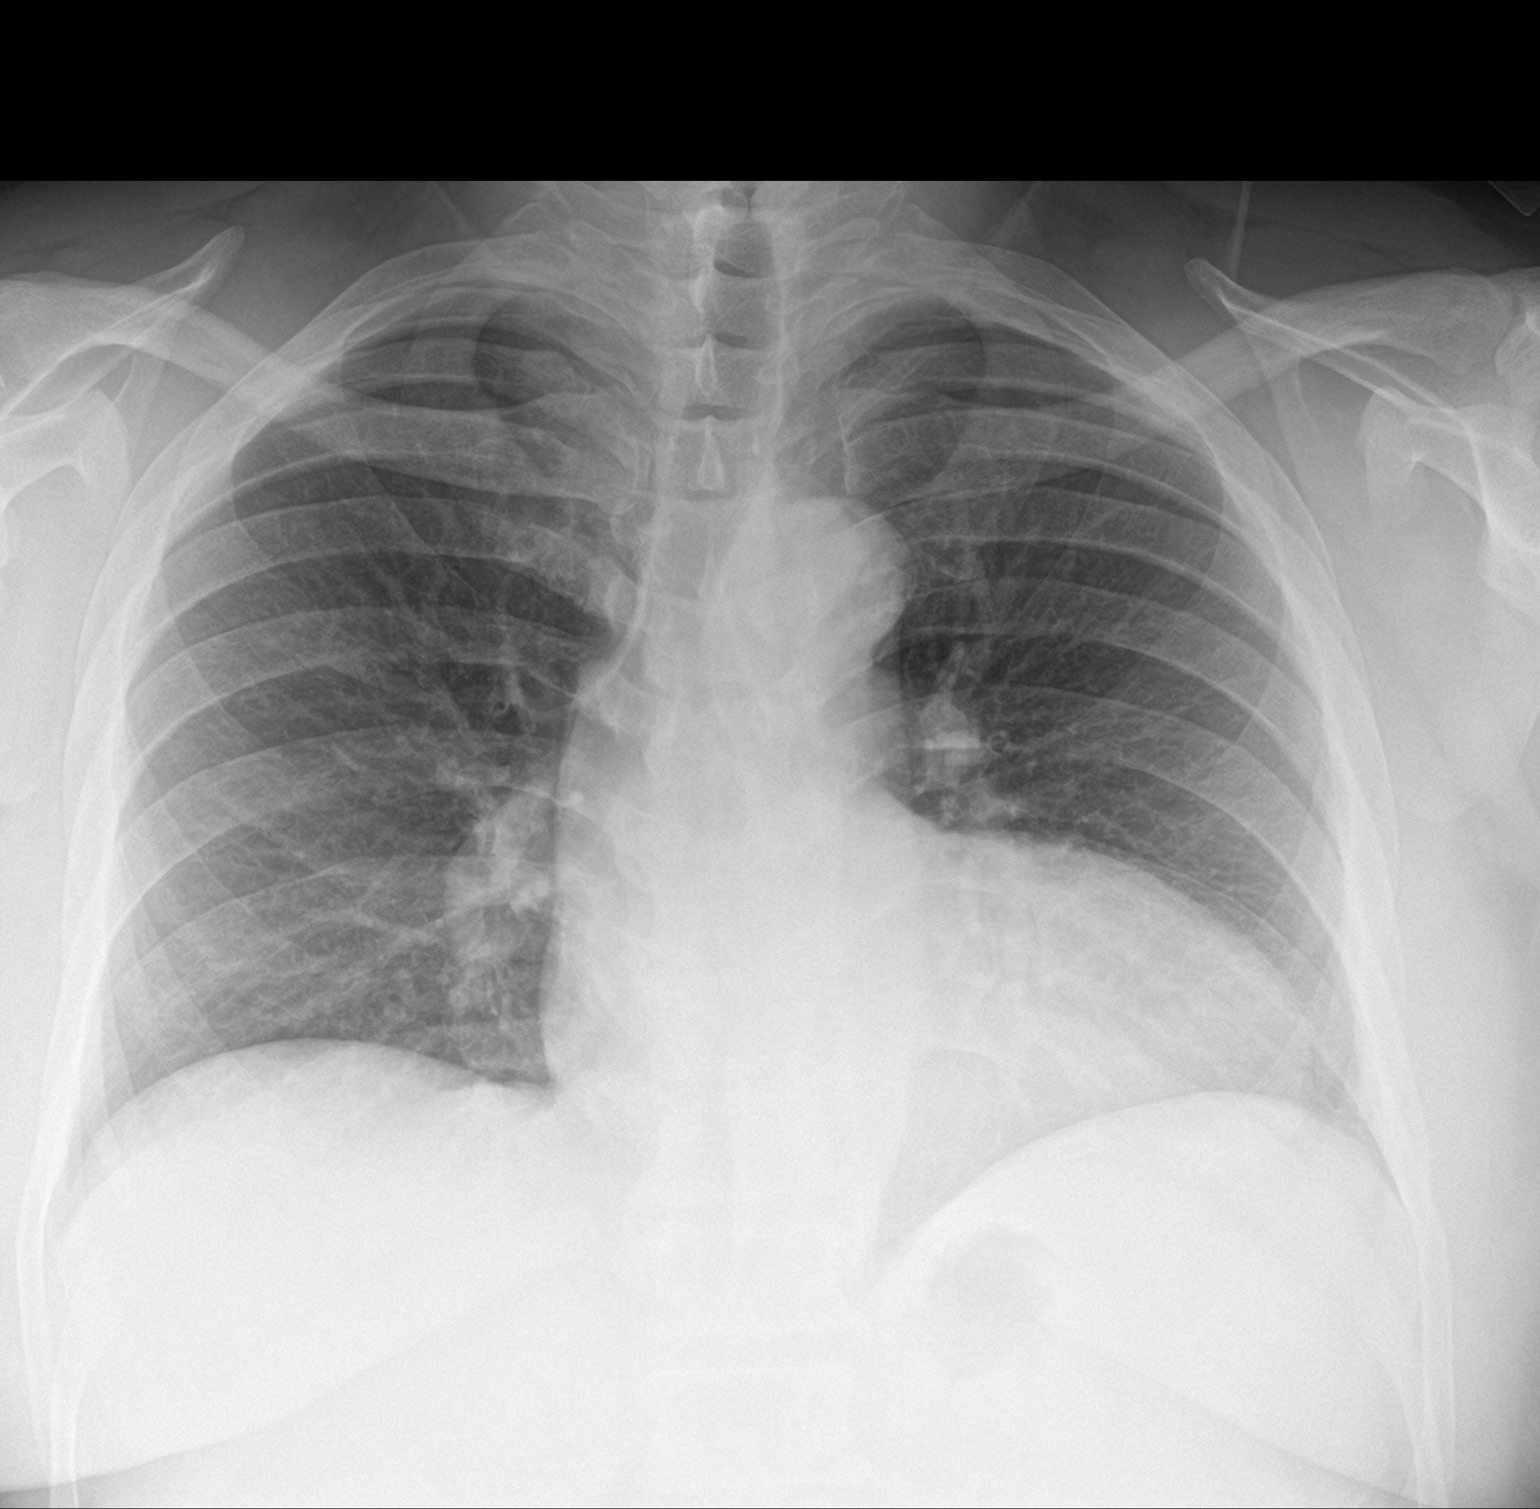

[chest lat]
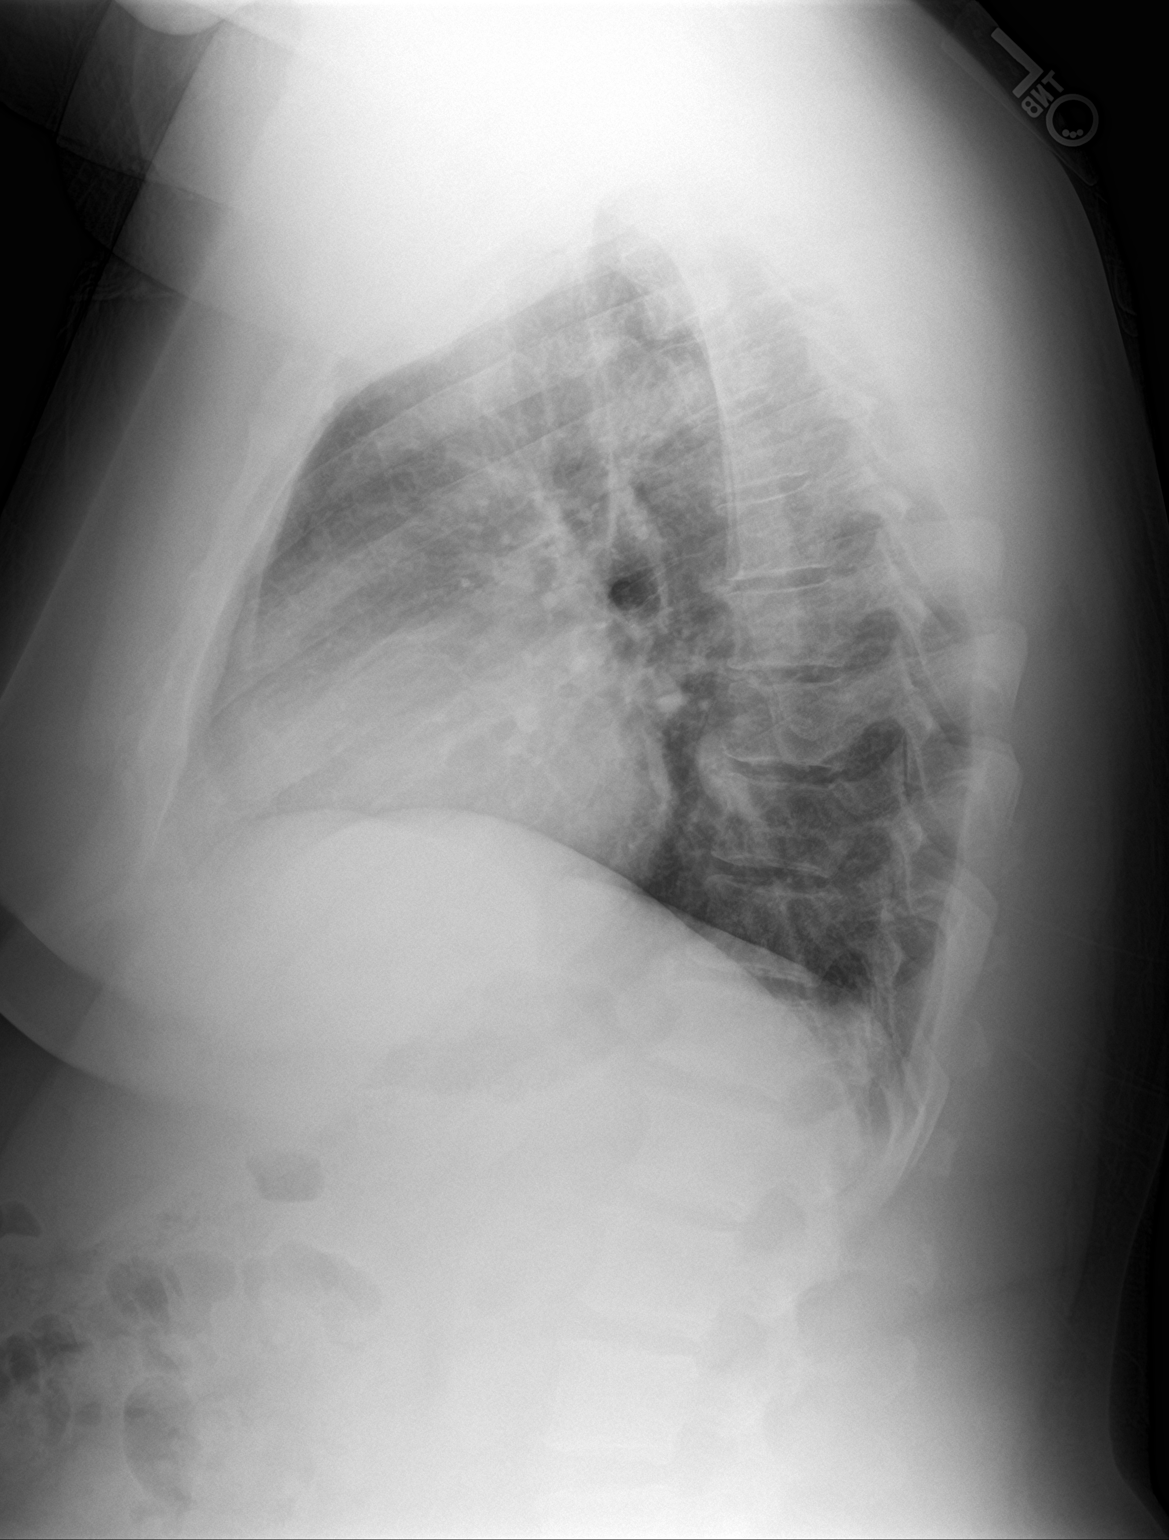

[2 of 2 positions shown; findings below may reference images not displayed]

FINDINGS: Stable cardiomegaly. Overall cardiomediastinal silhouette appears
stable in size and configuration. Lungs are clear. No pleural
effusion or pneumothorax seen. Osseous structures about the chest
are unremarkable.
IMPRESSION: Stable cardiomegaly. No active cardiopulmonary disease. No evidence
of pneumonia or pulmonary edema.

## 2022-11-03 ENCOUNTER — Emergency Department (HOSPITAL_COMMUNITY)
Admission: EM | Admit: 2022-11-03 | Discharge: 2022-11-04 | Disposition: A | Discharge status: Home / Self Care | Payer: Medicare Other | Attending: Emergency Medicine | Admitting: Emergency Medicine

## 2022-11-03 ENCOUNTER — Other Ambulatory Visit: Payer: Self-pay

## 2022-11-03 ENCOUNTER — Emergency Department (HOSPITAL_COMMUNITY): Payer: Medicare Other

## 2022-11-03 DIAGNOSIS — I10 Essential (primary) hypertension: Secondary | ICD-10-CM | POA: Insufficient documentation

## 2022-11-03 DIAGNOSIS — R0789 Other chest pain: Secondary | ICD-10-CM | POA: Diagnosis not present

## 2022-11-03 DIAGNOSIS — R079 Chest pain, unspecified: Secondary | ICD-10-CM | POA: Diagnosis present

## 2022-11-03 DIAGNOSIS — Z79899 Other long term (current) drug therapy: Secondary | ICD-10-CM | POA: Diagnosis not present

## 2022-11-03 LAB — BASIC METABOLIC PANEL
Anion gap: 10 (ref 5–15)
BUN: 9 mg/dL (ref 6–20)
CO2: 24 mmol/L (ref 22–32)
Calcium: 9.2 mg/dL (ref 8.9–10.3)
Chloride: 107 mmol/L (ref 98–111)
Creatinine, Ser: 1.02 mg/dL (ref 0.61–1.24)
GFR, Estimated: >60 mL/min (ref >60)
Glucose, Bld: 122 mg/dL — ABNORMAL HIGH (ref 70–99)
Potassium: 3.7 mmol/L (ref 3.5–5.1)
Sodium: 141 mmol/L (ref 135–145)

## 2022-11-03 LAB — CBC
HCT: 47.8 % (ref 39.0–52.0)
Hemoglobin: 15.5 g/dL (ref 13.0–17.0)
MCH: 30.2 pg (ref 26.0–34.0)
MCHC: 32.4 g/dL (ref 30.0–36.0)
MCV: 93 fL (ref 80.0–100.0)
Platelets: 224 10*3/uL (ref 150–400)
RBC: 5.14 MIL/uL (ref 4.22–5.81)
RDW: 13.2 % (ref 11.5–15.5)
WBC: 6.6 10*3/uL (ref 4.0–10.5)
nRBC: 0 % (ref 0.0–0.2)

## 2022-11-03 LAB — TROPONIN I (HIGH SENSITIVITY)
Troponin I (High Sensitivity): 3 ng/L (ref <18)
Troponin I (High Sensitivity): 4 ng/L (ref <18)

## 2022-11-03 NOTE — ED Notes (Signed)
Pt did not answer for vital update, will try again.

## 2022-11-03 NOTE — ED Triage Notes (Signed)
Pt. Stated, having chest pain started this new with SOB

## 2022-11-03 NOTE — ED Provider Triage Note (Signed)
Emergency Medicine Provider Triage Evaluation Note  Johnny Warner , a 51 y.o. male  was evaluated in triage.  Pt complains of concerns for left lower chest pain.  Denies recent injury, trauma, fall, twisting.  No meds tried prior to arrival.  Has associated shortness of breath.  Denies abdominal pain, nausea, vomiting.  Denies past medical history of diabetes, hyperlipidemia, cardiac catheterization, stents, MI.  Review of Systems  Positive:  Negative:   Physical Exam  BP (!) 140/97 (BP Location: Right Arm)   Pulse (!) 58   Temp 98.1 F (36.7 C) (Oral)   Resp 20   SpO2 96%  Gen:   Awake, no distress   Resp:  Normal effort  MSK:   Moves extremities without difficulty  Other:  No tenderness to palpation noted to chest wall, left ribs, abdominal region.  Medical Decision Making  Medically screening exam initiated at 10:33 AM.  Appropriate orders placed.  Althea Charon was informed that the remainder of the evaluation will be completed by another provider, this initial triage assessment does not replace that evaluation, and the importance of remaining in the ED until their evaluation is complete.     Lavren Lewan A, PA-C 11/03/22 1034

## 2022-11-04 MED ORDER — NAPROXEN 500 MG PO TABS: 500.0000 mg | ORAL_TABLET | Freq: Two times a day (BID) | ORAL | 0 refills | Status: AC

## 2022-11-04 MED ORDER — IBUPROFEN 400 MG PO TABS: 600.0000 mg | ORAL_TABLET | Freq: Once | ORAL | Status: DC

## 2022-11-04 NOTE — ED Provider Notes (Signed)
[facility] EMERGENCY DEPARTMENT Provider Note   CSN: [id number] Arrival date & time: 11/03/22  1016     History  Chief Complaint  Patient presents with   Chest Pain   Shortness of Breath    Johnny Warner is a 51 y.o. male.  HPI     This is a 51 year old male who presents with chest pain.  Patient states that yesterday morning he woke up with chest discomfort over his left chest.  It was nonradiating.  It is worse to touch.  Denies any injury or heavy lifting.  Patient states that he wears CPAP at night and thought it may be related.  He has not had any recent fevers or cough.  Denies history of diabetes, hypertension, hyperlipidemia, coronary artery disease.  Currently he states that the chest pain is throbbing.  Home Medications Prior to Admission medications   Medication Sig Start Date End Date Taking? Authorizing Provider  naproxen (NAPROSYN) 500 MG tablet Take 1 tablet (500 mg total) by mouth 2 (two) times daily. 11/04/22  Yes Yerlin Gasparyan, Mayer Masker, MD  amLODipine (NORVASC) 5 MG tablet Take 5 mg by mouth daily.    [provider]  clotrimazole-betamethasone (LOTRISONE) cream Apply to affected area 2 times daily prn 02/05/19   Eustace Moore, MD  Cyanocobalamin (VITAMIN B12 PO) Take by mouth.    [provider]  diclofenac (VOLTAREN) 75 MG EC tablet Take 1 tablet (75 mg total) by mouth 2 (two) times daily. 08/22/19   Elson Areas, PA-C  fluconazole (DIFLUCAN) 150 MG tablet Take 1 tablet (150 mg total) by mouth daily. Repeat in 1 week if needed 02/05/19   Eustace Moore, MD  hydrOXYzine (ATARAX/VISTARIL) 25 MG tablet Take 1 tablet (25 mg total) by mouth every 6 (six) hours. 02/05/19   Eustace Moore, MD  losartan-hydrochlorothiazide (HYZAAR) 100-12.5 MG tablet Take 1 tablet by mouth daily.    [provider]  methocarbamol (ROBAXIN) 500 MG tablet Take 1 tablet (500 mg total) by mouth 2 (two) times daily. 08/22/19   Elson Areas, PA-C  UNKNOWN TO PATIENT Allergy med OTC    [provider]  VITAMIN D PO Take by mouth.    [provider]      Allergies    Patient has no known allergies.    Review of Systems   Review of Systems  Constitutional:  Negative for fever.  Respiratory:  Negative for shortness of breath.   Cardiovascular:  Positive for chest pain.  All other systems reviewed and are negative.   Physical Exam Updated Vital Signs BP (!) 149/99   Pulse 64   Temp 97.8 F (36.6 C) (Oral)   Resp 16   Ht 1.727 m (5\' 8" )   Wt 112.5 kg   SpO2 99%   BMI 37.71 kg/m  Physical Exam Vitals and nursing note reviewed.  Constitutional:      Appearance: He is well-developed. He is obese. He is not ill-appearing.  HENT:     Head: Normocephalic and atraumatic.  Eyes:     Pupils: Pupils are equal, round, and reactive to light.  Cardiovascular:     Rate and Rhythm: Normal rate and regular rhythm.     Heart sounds: Normal heart sounds. No murmur heard. Pulmonary:     Effort: Pulmonary effort is normal. No respiratory distress.     Breath sounds: Normal breath sounds. No wheezing.  Chest:     Chest wall: Tenderness present.  Comments: Left-sided chest tenderness to palpation, no overlying skin changes, no crepitus Abdominal:     General: Bowel sounds are normal.     Palpations: Abdomen is soft.     Tenderness: There is no abdominal tenderness. There is no rebound.  Musculoskeletal:     Cervical back: Neck supple.  Lymphadenopathy:     Cervical: No cervical adenopathy.  Skin:    General: Skin is warm and dry.  Neurological:     Mental Status: He is alert and oriented to person, place, and time.  Psychiatric:        Mood and Affect: Mood normal.     ED Results / Procedures / Treatments   Labs (all labs ordered are listed, but only abnormal results are displayed) Labs Reviewed  BASIC METABOLIC PANEL - Abnormal; Notable for the following components:      Result Value    Glucose, Bld 122 (*)    All other components within normal limits  CBC  TROPONIN I (HIGH SENSITIVITY)  TROPONIN I (HIGH SENSITIVITY)    EKG EKG Interpretation  Date/Time:  Tuesday November 03 2022 10:28:20 EST Ventricular Rate:  64 PR Interval:  150 QRS Duration: 84 QT Interval:  400 QTC Calculation: 412 R Axis:   24 Text Interpretation: Normal sinus rhythm Cannot rule out Anterior infarct , age undetermined Abnormal ECG When compared with ECG of 22-Aug-2019 11:23, PREVIOUS ECG IS PRESENT Confirmed by Ross Marcus ([id number]) on 11/04/2022 2:57:28 AM  Radiology DG Chest 1 View  Result Date: 11/03/2022 CLINICAL DATA:  Chest pain EXAM: CHEST  1 VIEW COMPARISON:  08/22/2019 FINDINGS: Heart size is mildly enlarged, stable. No focal airspace consolidation, pleural effusion, or pneumothorax. No acute bony findings. IMPRESSION: No active disease. Electronically Signed   By: Duanne Guess D.O.   On: 11/03/2022 11:10    Procedures Procedures    Medications Ordered in ED Medications  ibuprofen (ADVIL) tablet 600 mg (has no administration in time range)    ED Course/ Medical Decision Making/ A&P                           Medical Decision Making Risk Prescription drug management.   This patient presents to the ED for concern of chest pain, this involves an extensive number of treatment options, and is a complaint that carries with it a high risk of complications and morbidity.  I considered the following differential and admission for this acute, potentially life threatening condition.  The differential diagnosis includes ACS, PE, pneumonia, pneumothorax, musculoskeletal etiology  MDM:    This is a 51 year old male who presents with chest pain.  He is nontoxic-appearing and vital signs are reassuring.  He is obese.  Score is 2.  EKG shows no evidence of acute arrhythmia or ischemia.  Troponin x2 negative.  Doubt primary ACS given history and reproducibility of pain on exam.   Chest x-ray without pneumothorax or pneumonia.  Low suspicion for PE.  We discussed supportive therapy for likely chest wall pain.  Additionally, I did recommend cardiology follow-up just given his risk factors to have a baseline stress test.  Patient is agreeable to plan.  (Labs, imaging, consults)  Labs: I Ordered, and personally interpreted labs.  The pertinent results include: CBC, BMP, troponin  Imaging Studies ordered: I ordered imaging studies including chest x-ray I independently visualized and interpreted imaging. I agree with the radiologist interpretation  Additional history obtained from chart review.  External records from  outside source obtained and reviewed including prior evaluations  Cardiac Monitoring: The patient was maintained on a cardiac monitor.  I personally viewed and interpreted the cardiac monitored which showed an underlying rhythm of: Sinus rhythm  Reevaluation: After the interventions noted above, I reevaluated the patient and found that they have :stayed the same  Social Determinants of Health:  lives independently  Disposition: Discharge  Co morbidities that complicate the patient evaluation  Past Medical History:  Diagnosis Date   Arthritis    Hypertension      Medicines Meds ordered this encounter  Medications   ibuprofen (ADVIL) tablet 600 mg   naproxen (NAPROSYN) 500 MG tablet    Sig: Take 1 tablet (500 mg total) by mouth 2 (two) times daily.    Dispense:  30 tablet    Refill:  0    I have reviewed the patients home medicines and have made adjustments as needed  Problem List / ED Course: Problem List Items Addressed This Visit   None Visit Diagnoses     Atypical chest pain    -  Primary   Relevant Orders   Ambulatory referral to Cardiology   Chest wall pain                       Final Clinical Impression(s) / ED Diagnoses Final diagnoses:  Atypical chest pain  Chest wall pain    Rx / DC Orders ED Discharge  Orders          Ordered    Ambulatory referral to Cardiology        11/04/22 0347    naproxen (NAPROSYN) 500 MG tablet  2 times daily        11/04/22 0348              Xiao Graul, Mayer Masker, MD 11/04/22 [phone]

## 2022-11-04 NOTE — Discharge Instructions (Addendum)
Seen today for chest pain.  Your heart testing today is reassuring.  Given her age and risk factors, you should follow-up with cardiology for definitive stress testing.  Take naproxen as needed for pain.

## 2023-05-23 ENCOUNTER — Other Ambulatory Visit: Payer: Self-pay

## 2023-05-23 ENCOUNTER — Encounter (HOSPITAL_COMMUNITY): Payer: Self-pay | Admitting: Emergency Medicine

## 2023-05-23 ENCOUNTER — Ambulatory Visit (HOSPITAL_COMMUNITY)
Admission: EM | Admit: 2023-05-23 | Discharge: 2023-05-23 | Disposition: A | Discharge status: Home / Self Care | Payer: 59 | Attending: Physician Assistant | Admitting: Physician Assistant

## 2023-05-23 DIAGNOSIS — H00011 Hordeolum externum right upper eyelid: Secondary | ICD-10-CM

## 2023-05-23 MED ORDER — ERYTHROMYCIN 5 MG/GM OP OINT: TOPICAL_OINTMENT | OPHTHALMIC | 0 refills | Status: AC

## 2023-05-23 NOTE — ED Triage Notes (Signed)
Reports right eye started feeling painful on Friday.  eyelid slightly swollen, reports slight crustiness this morning.    Patient does not wear glasses or contacts.  Patient reports no change in vision

## 2023-05-23 NOTE — ED Provider Notes (Signed)
[facility]    CSN: [id number] Arrival date & time: 05/23/23  1317      History   Chief Complaint Chief Complaint  Patient presents with   Eye Pain    HPI Johnny Warner is a 52 y.o. male.   Patient presents today with a several day history of painful swollen right upper eyelid.  Denies any known injury.  Denies any ocular symptoms including visual disturbance, photophobia, foreign body sensation, eye pain.  Reports eyelid discomfort is rated 7 on a 0-10 pain scale, described as pressure, no aggravating relieving factors identified.  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  He has not tried any over-the-counter medication for symptom management.  He does not follow with an ophthalmologist.  He does not wear glasses or contacts.    Past Medical History:  Diagnosis Date   Arthritis    Hypertension     There are no problems to display for this patient.   Past Surgical History:  Procedure Laterality Date   COSMETIC SURGERY     KNEE SURGERY     ROTATOR CUFF REPAIR         Home Medications    Prior to Admission medications   Medication Sig Start Date End Date Taking? Authorizing Provider  erythromycin ophthalmic ointment Place a 1/2 inch ribbon of ointment into the lower eyelid of right eye twice a day for 7 days 05/23/23  Yes Primus Gritton K, PA-C  amLODipine (NORVASC) 5 MG tablet Take 5 mg by mouth daily.    [provider]  clotrimazole-betamethasone (LOTRISONE) cream Apply to affected area 2 times daily prn 02/05/19   Eustace Moore, MD  Cyanocobalamin (VITAMIN B12 PO) Take by mouth.    [provider]  diclofenac (VOLTAREN) 75 MG EC tablet Take 1 tablet (75 mg total) by mouth 2 (two) times daily. 08/22/19   Elson Areas, PA-C  fluconazole (DIFLUCAN) 150 MG tablet Take 1 tablet (150 mg total) by mouth daily. Repeat in 1 week if needed Patient not taking: Reported on 05/23/2023  02/05/19   Eustace Moore, MD  hydrOXYzine (ATARAX/VISTARIL) 25 MG tablet Take 1 tablet (25 mg total) by mouth every 6 (six) hours. 02/05/19   Eustace Moore, MD  losartan-hydrochlorothiazide (HYZAAR) 100-12.5 MG tablet Take 1 tablet by mouth daily.    [provider]  methocarbamol (ROBAXIN) 500 MG tablet Take 1 tablet (500 mg total) by mouth 2 (two) times daily. Patient not taking: Reported on 05/23/2023 08/22/19   Elson Areas, PA-C  naproxen (NAPROSYN) 500 MG tablet Take 1 tablet (500 mg total) by mouth 2 (two) times daily. 11/04/22   Horton, Mayer Masker, MD  UNKNOWN TO PATIENT Allergy med OTC    [provider]  VITAMIN D PO Take by mouth.    [provider]    Family History Family History  Problem Relation Age of Onset   Diabetes Mother    Cancer Mother    Heart disease Father     Social History Social History   Tobacco Use   Smoking status: Never   Smokeless tobacco: Never  Vaping Use   Vaping Use: Never used  Substance Use Topics   Alcohol use: No   Drug use: No     Allergies   Patient has no known allergies.   Review of Systems Review of Systems  Constitutional:  Negative for activity change, appetite change, fatigue  and fever.  Eyes:  Negative for photophobia, pain, discharge, redness, itching and visual disturbance.  Gastrointestinal:  Negative for abdominal pain, diarrhea, nausea and vomiting.  Neurological:  Negative for dizziness, light-headedness and headaches.     Physical Exam Triage Vital Signs ED Triage Vitals  Enc Vitals Group     BP 05/23/23 1441 138/88     Pulse Rate 05/23/23 1441 66     Resp 05/23/23 1441 20     Temp 05/23/23 1441 98.1 F (36.7 C)     Temp Source 05/23/23 1441 Oral     SpO2 05/23/23 1441 96 %     Weight --      Height --      Head Circumference --      Peak Flow --      Pain Score 05/23/23 1438 7     Pain Loc --      Pain Edu? --      Excl. in GC? --    No data found.  Updated  Vital Signs BP 138/88 (BP Location: Right Arm) Comment (BP Location): large cuff  Pulse 66   Temp 98.1 F (36.7 C) (Oral)   Resp 20   SpO2 96%   Visual Acuity Right Eye Distance:   Left Eye Distance:   Bilateral Distance:    Right Eye Near:   Left Eye Near:    Bilateral Near:     Physical Exam Vitals reviewed.  Constitutional:      General: He is awake.     Appearance: Normal appearance. He is well-developed. He is not ill-appearing.     Comments: Very pleasant male appears stated age in no acute distress sitting comfortably in exam room  HENT:     Head: Normocephalic and atraumatic.  Eyes:     General:        Right eye: Hordeolum present. No foreign body or discharge.        Left eye: No foreign body, discharge or hordeolum.     Extraocular Movements: Extraocular movements intact.     Conjunctiva/sclera: Conjunctivae normal.     Pupils: Pupils are equal, round, and reactive to light.  Cardiovascular:     Rate and Rhythm: Normal rate and regular rhythm.     Heart sounds: Normal heart sounds, S1 normal and S2 normal. No murmur heard. Pulmonary:     Effort: Pulmonary effort is normal.     Breath sounds: Normal breath sounds. No stridor. No wheezing, rhonchi or rales.     Comments: Clear to auscultation bilaterally Neurological:     Mental Status: He is alert.  Psychiatric:        Behavior: Behavior is cooperative.      UC Treatments / Results  Labs (all labs ordered are listed, but only abnormal results are displayed) Labs Reviewed - No data to display  EKG   Radiology No results found.  Procedures Procedures (including critical care time)  Medications Ordered in UC Medications - No data to display  Initial Impression / Assessment and Plan / UC Course  I have reviewed the triage vital signs and the nursing notes.  Pertinent labs & imaging results that were available during my care of the patient were reviewed by me and considered in my medical decision  making (see chart for details).     Hordeolum noted on exam.  Fluorescein staining was deferred as patient denies any foreign body sensation or eye symptoms.  He was encouraged to use warm compresses multiple  times per day.  He was also started on erythromycin ointment I discussed that he should wash his hands prior to handling the medication and avoid touching tip of medication bottle to the eye to prevent contamination of the medicine.  If his symptoms or not improving he needs to see ophthalmology was given contact information for local provider who is on-call.  We discussed that if he has any worsening symptoms including increasing pain, spread of erythema to the periorbital region, fever, headache, vision change, nausea/vomiting he needs to be seen immediately.  Strict return precautions given.  Offered work excuse note that he declined.  Final Clinical Impressions(s) / UC Diagnoses   Final diagnoses:  Hordeolum externum of right upper eyelid     Discharge Instructions      Apply erythromycin ointment twice daily.  Make sure to wash your hands prior to handling medication and do not touch tip of medication bottle to the eye as this can contaminate the medicine.  Use warm compress several times per day.  If your symptoms or not improving please follow-up with ophthalmology; call to schedule an appointment.  If anything worsens and you have rapid spread of redness/swelling, increasing pain, vision change, fever you should be seen immediately.     ED Prescriptions     Medication Sig Dispense Auth. Provider   erythromycin ophthalmic ointment Place a 1/2 inch ribbon of ointment into the lower eyelid of right eye twice a day for 7 days 3.5 g Naziya Hegwood K, PA-C      PDMP not reviewed this encounter.   Jeani Hawking, PA-C 05/23/23 1459

## 2023-05-23 NOTE — Discharge Instructions (Addendum)
Apply erythromycin ointment twice daily.  Make sure to wash your hands prior to handling medication and do not touch tip of medication bottle to the eye as this can contaminate the medicine.  Use warm compress several times per day.  If your symptoms or not improving please follow-up with ophthalmology; call to schedule an appointment.  If anything worsens and you have rapid spread of redness/swelling, increasing pain, vision change, fever you should be seen immediately.
